# Patient Record
Sex: Female | Born: 1950 | Race: Black or African American | Hispanic: No | Marital: Married | Smoking: Never smoker
Health system: Southern US, Community
[De-identification: ages and names within clinical notes are randomized; demographics above are authoritative.]

## PROBLEM LIST (undated history)

## (undated) DIAGNOSIS — G4733 Obstructive sleep apnea (adult) (pediatric): Secondary | ICD-10-CM

## (undated) DIAGNOSIS — E119 Type 2 diabetes mellitus without complications: Secondary | ICD-10-CM

## (undated) DIAGNOSIS — Z9989 Dependence on other enabling machines and devices: Secondary | ICD-10-CM

## (undated) DIAGNOSIS — M1990 Unspecified osteoarthritis, unspecified site: Secondary | ICD-10-CM

## (undated) DIAGNOSIS — I10 Essential (primary) hypertension: Secondary | ICD-10-CM

## (undated) DIAGNOSIS — Z5189 Encounter for other specified aftercare: Secondary | ICD-10-CM

## (undated) DIAGNOSIS — I2510 Atherosclerotic heart disease of native coronary artery without angina pectoris: Secondary | ICD-10-CM

## (undated) DIAGNOSIS — Z9861 Coronary angioplasty status: Secondary | ICD-10-CM

## (undated) DIAGNOSIS — J45909 Unspecified asthma, uncomplicated: Secondary | ICD-10-CM

## (undated) HISTORY — PX: DERMOID CYST  EXCISION: SHX1452

## (undated) HISTORY — PX: OTHER SURGICAL HISTORY: SHX169

---

## 2018-02-09 ENCOUNTER — Inpatient Hospital Stay (HOSPITAL_COMMUNITY): Payer: BLUE CROSS/BLUE SHIELD

## 2018-02-09 ENCOUNTER — Emergency Department (HOSPITAL_COMMUNITY): Payer: BLUE CROSS/BLUE SHIELD

## 2018-02-09 ENCOUNTER — Other Ambulatory Visit: Payer: Self-pay

## 2018-02-09 ENCOUNTER — Encounter (HOSPITAL_COMMUNITY): Payer: Self-pay | Admitting: Emergency Medicine

## 2018-02-09 ENCOUNTER — Inpatient Hospital Stay (HOSPITAL_COMMUNITY)
Admission: EM | Admit: 2018-02-09 | Discharge: 2018-02-14 | DRG: 200 | Disposition: A | Discharge status: Home / Self Care | Payer: BLUE CROSS/BLUE SHIELD | Attending: General Surgery | Admitting: General Surgery

## 2018-02-09 DIAGNOSIS — S2249XA Multiple fractures of ribs, unspecified side, initial encounter for closed fracture: Secondary | ICD-10-CM | POA: Diagnosis present

## 2018-02-09 DIAGNOSIS — R109 Unspecified abdominal pain: Secondary | ICD-10-CM

## 2018-02-09 DIAGNOSIS — Z23 Encounter for immunization: Secondary | ICD-10-CM | POA: Diagnosis not present

## 2018-02-09 DIAGNOSIS — S20211A Contusion of right front wall of thorax, initial encounter: Secondary | ICD-10-CM

## 2018-02-09 DIAGNOSIS — G4733 Obstructive sleep apnea (adult) (pediatric): Secondary | ICD-10-CM | POA: Diagnosis present

## 2018-02-09 DIAGNOSIS — Z7982 Long term (current) use of aspirin: Secondary | ICD-10-CM

## 2018-02-09 DIAGNOSIS — Z9861 Coronary angioplasty status: Secondary | ICD-10-CM | POA: Diagnosis not present

## 2018-02-09 DIAGNOSIS — J939 Pneumothorax, unspecified: Secondary | ICD-10-CM

## 2018-02-09 DIAGNOSIS — I10 Essential (primary) hypertension: Secondary | ICD-10-CM | POA: Diagnosis present

## 2018-02-09 DIAGNOSIS — S270XXA Traumatic pneumothorax, initial encounter: Secondary | ICD-10-CM | POA: Diagnosis present

## 2018-02-09 DIAGNOSIS — E119 Type 2 diabetes mellitus without complications: Secondary | ICD-10-CM | POA: Diagnosis present

## 2018-02-09 DIAGNOSIS — Z7984 Long term (current) use of oral hypoglycemic drugs: Secondary | ICD-10-CM

## 2018-02-09 DIAGNOSIS — I2510 Atherosclerotic heart disease of native coronary artery without angina pectoris: Secondary | ICD-10-CM | POA: Diagnosis present

## 2018-02-09 DIAGNOSIS — Z9989 Dependence on other enabling machines and devices: Secondary | ICD-10-CM

## 2018-02-09 DIAGNOSIS — J90 Pleural effusion, not elsewhere classified: Secondary | ICD-10-CM | POA: Diagnosis present

## 2018-02-09 DIAGNOSIS — S2241XA Multiple fractures of ribs, right side, initial encounter for closed fracture: Secondary | ICD-10-CM | POA: Diagnosis present

## 2018-02-09 DIAGNOSIS — Y92410 Unspecified street and highway as the place of occurrence of the external cause: Secondary | ICD-10-CM

## 2018-02-09 HISTORY — DX: Unspecified osteoarthritis, unspecified site: M19.90

## 2018-02-09 HISTORY — DX: Obstructive sleep apnea (adult) (pediatric): G47.33

## 2018-02-09 HISTORY — DX: Type 2 diabetes mellitus without complications: E11.9

## 2018-02-09 HISTORY — DX: Coronary angioplasty status: Z98.61

## 2018-02-09 HISTORY — DX: Essential (primary) hypertension: I10

## 2018-02-09 HISTORY — DX: Unspecified asthma, uncomplicated: J45.909

## 2018-02-09 HISTORY — DX: Atherosclerotic heart disease of native coronary artery without angina pectoris: I25.10

## 2018-02-09 HISTORY — DX: Dependence on other enabling machines and devices: Z99.89

## 2018-02-09 HISTORY — DX: Encounter for other specified aftercare: Z51.89

## 2018-02-09 LAB — I-STAT CHEM 8, ED
BUN: 16 mg/dL (ref 8–23)
CALCIUM ION: 1.13 mmol/L — AB (ref 1.15–1.40)
CREATININE: 1 mg/dL (ref 0.44–1.00)
Chloride: 99 mmol/L (ref 98–111)
Glucose, Bld: 315 mg/dL — ABNORMAL HIGH (ref 70–99)
HEMATOCRIT: 38 % (ref 36.0–46.0)
HEMOGLOBIN: 12.9 g/dL (ref 12.0–15.0)
POTASSIUM: 3.2 mmol/L — AB (ref 3.5–5.1)
Sodium: 139 mmol/L (ref 135–145)
TCO2: 28 mmol/L (ref 22–32)

## 2018-02-09 LAB — COMPREHENSIVE METABOLIC PANEL
ALT: 100 U/L — ABNORMAL HIGH (ref 0–44)
ANION GAP: 11 (ref 5–15)
AST: 89 U/L — ABNORMAL HIGH (ref 15–41)
Albumin: 3.6 g/dL (ref 3.5–5.0)
Alkaline Phosphatase: 49 U/L (ref 38–126)
BILIRUBIN TOTAL: 1 mg/dL (ref 0.3–1.2)
BUN: 15 mg/dL (ref 8–23)
CO2: 26 mmol/L (ref 22–32)
Calcium: 8.9 mg/dL (ref 8.9–10.3)
Chloride: 101 mmol/L (ref 98–111)
Creatinine, Ser: 1.14 mg/dL — ABNORMAL HIGH (ref 0.44–1.00)
GFR calc Af Amer: 57 mL/min — ABNORMAL LOW (ref >60)
GFR, EST NON AFRICAN AMERICAN: 49 mL/min — AB (ref >60)
Glucose, Bld: 324 mg/dL — ABNORMAL HIGH (ref 70–99)
POTASSIUM: 3.3 mmol/L — AB (ref 3.5–5.1)
Sodium: 138 mmol/L (ref 135–145)
TOTAL PROTEIN: 6.9 g/dL (ref 6.5–8.1)

## 2018-02-09 LAB — CBC
HEMATOCRIT: 38.4 % (ref 36.0–46.0)
HEMOGLOBIN: 12.4 g/dL (ref 12.0–15.0)
MCH: 26.1 pg (ref 26.0–34.0)
MCHC: 32.3 g/dL (ref 30.0–36.0)
MCV: 80.7 fL (ref 78.0–100.0)
Platelets: 324 10*3/uL (ref 150–400)
RBC: 4.76 MIL/uL (ref 3.87–5.11)
RDW: 12.8 % (ref 11.5–15.5)
WBC: 9.8 10*3/uL (ref 4.0–10.5)

## 2018-02-09 LAB — GLUCOSE, CAPILLARY
Glucose-Capillary: 264 mg/dL — ABNORMAL HIGH (ref 70–99)
Glucose-Capillary: 271 mg/dL — ABNORMAL HIGH (ref 70–99)

## 2018-02-09 MED ORDER — MORPHINE SULFATE (PF) 4 MG/ML IV SOLN
2.0000 mg | INTRAVENOUS | Status: DC | PRN
  Administered 2018-02-09 – 2018-02-10 (×2): 3 mg via INTRAVENOUS
  Filled 2018-02-09 (×2): qty 1

## 2018-02-09 MED ORDER — ASPIRIN EC 81 MG PO TBEC
81.0000 mg | DELAYED_RELEASE_TABLET | Freq: Every day | ORAL | Status: DC
  Administered 2018-02-10 – 2018-02-14 (×5): 81 mg via ORAL
  Filled 2018-02-09 (×5): qty 1

## 2018-02-09 MED ORDER — GLIMEPIRIDE 2 MG PO TABS
4.0000 mg | ORAL_TABLET | Freq: Two times a day (BID) | ORAL | Status: DC
  Administered 2018-02-09 – 2018-02-14 (×10): 4 mg via ORAL
  Filled 2018-02-09: qty 1
  Filled 2018-02-09 (×3): qty 2
  Filled 2018-02-09 (×2): qty 1
  Filled 2018-02-09 (×5): qty 2
  Filled 2018-02-09: qty 1

## 2018-02-09 MED ORDER — INSULIN ASPART 100 UNIT/ML ~~LOC~~ SOLN
0.0000 [IU] | SUBCUTANEOUS | Status: DC
  Administered 2018-02-09: 5 [IU] via SUBCUTANEOUS
  Administered 2018-02-09 (×2): 8 [IU] via SUBCUTANEOUS
  Administered 2018-02-10 (×2): 3 [IU] via SUBCUTANEOUS
  Administered 2018-02-10: 5 [IU] via SUBCUTANEOUS
  Administered 2018-02-10 (×2): 3 [IU] via SUBCUTANEOUS
  Administered 2018-02-10 – 2018-02-11 (×2): 5 [IU] via SUBCUTANEOUS
  Administered 2018-02-11: 2 [IU] via SUBCUTANEOUS
  Administered 2018-02-11: 5 [IU] via SUBCUTANEOUS
  Administered 2018-02-11: 11 [IU] via SUBCUTANEOUS
  Administered 2018-02-11 – 2018-02-12 (×3): 3 [IU] via SUBCUTANEOUS
  Administered 2018-02-12: 2 [IU] via SUBCUTANEOUS
  Administered 2018-02-12: 5 [IU] via SUBCUTANEOUS
  Administered 2018-02-12: 2 [IU] via SUBCUTANEOUS
  Administered 2018-02-12: 5 [IU] via SUBCUTANEOUS
  Administered 2018-02-12: 8 [IU] via SUBCUTANEOUS
  Administered 2018-02-13: 3 [IU] via SUBCUTANEOUS
  Administered 2018-02-13: 5 [IU] via SUBCUTANEOUS

## 2018-02-09 MED ORDER — AMLODIPINE BESYLATE 5 MG PO TABS
5.0000 mg | ORAL_TABLET | Freq: Every day | ORAL | Status: DC
  Administered 2018-02-10 – 2018-02-14 (×5): 5 mg via ORAL
  Filled 2018-02-09 (×5): qty 1

## 2018-02-09 MED ORDER — MORPHINE SULFATE (PF) 4 MG/ML IV SOLN
6.0000 mg | Freq: Once | INTRAVENOUS | Status: AC
  Administered 2018-02-09: 6 mg via INTRAVENOUS
  Filled 2018-02-09: qty 2

## 2018-02-09 MED ORDER — OXYCODONE HCL 5 MG PO TABS
5.0000 mg | ORAL_TABLET | ORAL | Status: DC | PRN
  Administered 2018-02-09 – 2018-02-10 (×4): 5 mg via ORAL
  Filled 2018-02-09 (×4): qty 1

## 2018-02-09 MED ORDER — FAMOTIDINE 20 MG PO TABS
20.0000 mg | ORAL_TABLET | Freq: Two times a day (BID) | ORAL | Status: DC
  Administered 2018-02-09 – 2018-02-14 (×10): 20 mg via ORAL
  Filled 2018-02-09 (×11): qty 1

## 2018-02-09 MED ORDER — SENNOSIDES-DOCUSATE SODIUM 8.6-50 MG PO TABS
1.0000 | ORAL_TABLET | Freq: Two times a day (BID) | ORAL | Status: DC
  Administered 2018-02-10 – 2018-02-14 (×9): 1 via ORAL
  Filled 2018-02-09 (×9): qty 1

## 2018-02-09 MED ORDER — ONDANSETRON HCL 4 MG/2ML IJ SOLN: 4.0000 mg | Freq: Four times a day (QID) | INTRAMUSCULAR | Status: DC | PRN

## 2018-02-09 MED ORDER — IOHEXOL 300 MG/ML  SOLN
100.0000 mL | Freq: Once | INTRAMUSCULAR | Status: AC | PRN
  Administered 2018-02-09: 100 mL via INTRAVENOUS

## 2018-02-09 MED ORDER — POTASSIUM CHLORIDE IN NACL 20-0.9 MEQ/L-% IV SOLN
INTRAVENOUS | Status: DC
  Administered 2018-02-09 – 2018-02-10 (×2): via INTRAVENOUS
  Filled 2018-02-09 (×2): qty 1000

## 2018-02-09 MED ORDER — HYDROMORPHONE HCL 1 MG/ML IJ SOLN
1.0000 mg | Freq: Once | INTRAMUSCULAR | Status: AC
  Administered 2018-02-09: 1 mg via INTRAVENOUS
  Filled 2018-02-09: qty 1

## 2018-02-09 MED ORDER — ACETAMINOPHEN 500 MG PO TABS
1000.0000 mg | ORAL_TABLET | Freq: Three times a day (TID) | ORAL | Status: DC
  Administered 2018-02-09 – 2018-02-14 (×16): 1000 mg via ORAL
  Filled 2018-02-09 (×16): qty 2

## 2018-02-09 MED ORDER — METHOCARBAMOL 1000 MG/10ML IJ SOLN
500.0000 mg | Freq: Three times a day (TID) | INTRAVENOUS | Status: DC
  Administered 2018-02-09 – 2018-02-10 (×3): 500 mg via INTRAVENOUS
  Filled 2018-02-09 (×5): qty 5

## 2018-02-09 MED ORDER — SODIUM CHLORIDE 0.9 % IV BOLUS
1000.0000 mL | Freq: Once | INTRAVENOUS | Status: AC
  Administered 2018-02-09: 1000 mL via INTRAVENOUS

## 2018-02-09 MED ORDER — DIPHENHYDRAMINE HCL 50 MG/ML IJ SOLN: 12.5000 mg | Freq: Four times a day (QID) | INTRAMUSCULAR | Status: DC | PRN

## 2018-02-09 MED ORDER — ONDANSETRON 4 MG PO TBDP: 4.0000 mg | ORAL_TABLET | Freq: Four times a day (QID) | ORAL | Status: DC | PRN

## 2018-02-09 MED ORDER — DIPHENHYDRAMINE HCL 12.5 MG/5ML PO ELIX: 12.5000 mg | ORAL_SOLUTION | Freq: Four times a day (QID) | ORAL | Status: DC | PRN

## 2018-02-09 MED ORDER — NITROGLYCERIN 0.4 MG SL SUBL: 0.4000 mg | SUBLINGUAL_TABLET | SUBLINGUAL | Status: DC | PRN
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
## 2018-02-09 NOTE — ED Notes (Signed)
Trauma at bedside.
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
## 2018-02-09 NOTE — Progress Notes (Signed)
Received patient from ED. Patient alert and oriented x4. Patient's son at bedside. Was called by patient placement and told patient will be transferring to 4N. Called report to [place], Charity fundraiserN. Rapid response at bedside to assist in transfer.

## 2018-02-09 NOTE — ED Notes (Signed)
Patient transported to CT 

## 2018-02-10 ENCOUNTER — Encounter (HOSPITAL_COMMUNITY): Payer: Self-pay | Admitting: General Practice

## 2018-02-10 ENCOUNTER — Inpatient Hospital Stay (HOSPITAL_COMMUNITY): Payer: BLUE CROSS/BLUE SHIELD

## 2018-02-10 LAB — CBC
HEMATOCRIT: 35.6 % — AB (ref 36.0–46.0)
Hemoglobin: 11.4 g/dL — ABNORMAL LOW (ref 12.0–15.0)
MCH: 25.6 pg — ABNORMAL LOW (ref 26.0–34.0)
MCHC: 32 g/dL (ref 30.0–36.0)
MCV: 79.8 fL (ref 78.0–100.0)
Platelets: 249 10*3/uL (ref 150–400)
RBC: 4.46 MIL/uL (ref 3.87–5.11)
RDW: 12.8 % (ref 11.5–15.5)
WBC: 9.8 10*3/uL (ref 4.0–10.5)

## 2018-02-10 LAB — GLUCOSE, CAPILLARY
GLUCOSE-CAPILLARY: 173 mg/dL — AB (ref 70–99)
GLUCOSE-CAPILLARY: 191 mg/dL — AB (ref 70–99)
GLUCOSE-CAPILLARY: 178 mg/dL — AB (ref 70–99)
Glucose-Capillary: 174 mg/dL — ABNORMAL HIGH (ref 70–99)
Glucose-Capillary: 205 mg/dL — ABNORMAL HIGH (ref 70–99)
Glucose-Capillary: 215 mg/dL — ABNORMAL HIGH (ref 70–99)
Glucose-Capillary: 218 mg/dL — ABNORMAL HIGH (ref 70–99)

## 2018-02-10 LAB — HEMOGLOBIN A1C
Hgb A1c MFr Bld: 7.9 % — ABNORMAL HIGH (ref 4.8–5.6)
Mean Plasma Glucose: 180.03 mg/dL

## 2018-02-10 LAB — BASIC METABOLIC PANEL
Anion gap: 11 (ref 5–15)
BUN: 8 mg/dL (ref 8–23)
CO2: 29 mmol/L (ref 22–32)
Calcium: 8.8 mg/dL — ABNORMAL LOW (ref 8.9–10.3)
Chloride: 97 mmol/L — ABNORMAL LOW (ref 98–111)
Creatinine, Ser: 0.81 mg/dL (ref 0.44–1.00)
GFR calc Af Amer: >60 mL/min (ref >60)
GFR calc non Af Amer: >60 mL/min (ref >60)
GLUCOSE: 197 mg/dL — AB (ref 70–99)
POTASSIUM: 3.5 mmol/L (ref 3.5–5.1)
Sodium: 137 mmol/L (ref 135–145)

## 2018-02-10 LAB — HIV ANTIBODY (ROUTINE TESTING W REFLEX): HIV SCREEN 4TH GENERATION: NONREACTIVE

## 2018-02-10 LAB — MRSA PCR SCREENING: MRSA BY PCR: NEGATIVE

## 2018-02-10 LAB — PROTIME-INR
INR: 0.98
Prothrombin Time: 12.9 seconds (ref 11.4–15.2)

## 2018-02-10 MED ORDER — POLYETHYLENE GLYCOL 3350 17 G PO PACK
17.0000 g | PACK | Freq: Every day | ORAL | Status: DC
  Administered 2018-02-10 – 2018-02-14 (×5): 17 g via ORAL
  Filled 2018-02-10 (×5): qty 1

## 2018-02-10 MED ORDER — ENOXAPARIN SODIUM 40 MG/0.4ML ~~LOC~~ SOLN
40.0000 mg | SUBCUTANEOUS | Status: DC
  Administered 2018-02-10 – 2018-02-14 (×5): 40 mg via SUBCUTANEOUS
  Filled 2018-02-10 (×5): qty 0.4

## 2018-02-10 MED ORDER — TRAMADOL HCL 50 MG PO TABS
50.0000 mg | ORAL_TABLET | Freq: Four times a day (QID) | ORAL | Status: DC
  Administered 2018-02-10 – 2018-02-14 (×18): 50 mg via ORAL
  Filled 2018-02-10 (×18): qty 1

## 2018-02-10 MED ORDER — INSULIN GLARGINE 100 UNIT/ML ~~LOC~~ SOLN
10.0000 [IU] | Freq: Every day | SUBCUTANEOUS | Status: DC
  Administered 2018-02-10: 10 [IU] via SUBCUTANEOUS
  Filled 2018-02-10: qty 0.1

## 2018-02-10 MED ORDER — OXYCODONE HCL 5 MG PO TABS
5.0000 mg | ORAL_TABLET | ORAL | Status: DC | PRN
  Administered 2018-02-10: 10 mg via ORAL
  Administered 2018-02-10: 5 mg via ORAL
  Administered 2018-02-11 – 2018-02-12 (×4): 10 mg via ORAL
  Filled 2018-02-10: qty 1
  Filled 2018-02-10 (×5): qty 2

## 2018-02-10 MED ORDER — METHOCARBAMOL 500 MG PO TABS: 500.0000 mg | ORAL_TABLET | Freq: Three times a day (TID) | ORAL | Status: DC

## 2018-02-10 MED ORDER — MORPHINE SULFATE (PF) 2 MG/ML IV SOLN
2.0000 mg | INTRAVENOUS | Status: DC | PRN
  Administered 2018-02-10 – 2018-02-11 (×3): 2 mg via INTRAVENOUS
  Filled 2018-02-10 (×3): qty 1

## 2018-02-10 MED ORDER — METHOCARBAMOL 500 MG PO TABS
500.0000 mg | ORAL_TABLET | Freq: Three times a day (TID) | ORAL | Status: DC
  Administered 2018-02-10 – 2018-02-14 (×13): 500 mg via ORAL
  Filled 2018-02-10 (×13): qty 1

## 2018-02-10 MED ORDER — PNEUMOCOCCAL VAC POLYVALENT 25 MCG/0.5ML IJ INJ
0.5000 mL | INJECTION | INTRAMUSCULAR | Status: AC
  Administered 2018-02-11: 0.5 mL via INTRAMUSCULAR
  Filled 2018-02-10: qty 0.5

## 2018-02-10 NOTE — Progress Notes (Signed)
Received patient to room 4NP03. Patient walked to new room and to BR with walker and assist X2. Patient is orientated to the room and call system. Patient c/o rib pain rated 9 out of 10. Pain med given, see EMAR. Patient hooked up to continuous cardiac monitor and VS are taken. Fresh water is given. Will continue to monitor.
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
## 2018-02-10 NOTE — Progress Notes (Addendum)
Inpatient Diabetes Program Recommendations  AACE/ADA: New Consensus Statement on Inpatient Glycemic Control (2019)  Target Ranges:  Prepandial:   less than 140 mg/dL      Peak postprandial:   less than 180 mg/dL (1-2 hours)      Critically ill patients:  140 - 180 mg/dL   Results for Haley Perry, Haley Perry (MRN [id number]) as of 02/10/2018 07:49  Ref. Range 02/09/2018 18:42 02/09/2018 20:13 02/09/2018 23:45 02/10/2018 03:51  Glucose-Capillary Latest Ref Range: 70 - 99 mg/dL 119264 (H) 147271 (H) 829215 (H) 174 (H)   Results for Haley Perry, Haley Perry (MRN [id number]) as of 02/10/2018 07:49  Ref. Range 02/10/2018 02:59  Hemoglobin A1C Latest Ref Range: 4.8 - 5.6 % 7.9 (H)   Review of Glycemic Control  Diabetes history: DM2 Outpatient Diabetes medications: Amaryl 4 mg BID, Metformin XR 1000 mg BID, Ozempic 0.5 mg Qweek (Wednesday) Current orders for Inpatient glycemic control: Amaryl 4 mg BID, Novolog 0-15 units Q4H  Inpatient Diabetes Program Recommendations: Insulin - Basal: Please consider ordering Lantus 10 units Q24H. HgbA1C: A1C 7.9% on 02/10/18 indicating an average glucose of 180 mg/dl over the past 2-3 months.  NOTE: Noted consult for Diabetes Coordinator. Chart reviewed. Will plan to talk with patient today.  Addendum 02/10/18@11 :15-Spoke with patient about diabetes and home regimen for diabetes control. Patient reports that she takes  Amaryl 4 mg BID, Metformin XR 1000 mg BID, Ozempic 0.5 mg Qweek (Wednesday) as an outpatient for diabetes control. Patient reports that she is taking DM medications as prescribed.  Patient states that she checks her glucose 1 time per day in the mornings and it is usually in the 100's mg/dl. Patient reports that she exercises regularly and follows carb modified diet.  Inquired about prior A1C and patient reports that her last A1C value was 7.3% 2 weeks ago.Informed of current A1C results (7.9% on 02/10/18).  Discussed glucose and A1C goals. Discussed current orders for  inpatient glycemic control. Discussed Lantus and Novolog insulin which will be used inpatient.  Patient states that she has a good appetite. Patient reports that she is in pain. Encouraged patient to call RN regarding pain to see if she is able to get pain medication now.  Patient verbalized understanding of information discussed and she states that she has no further questions at this time related to diabetes. Will continue to follow while inpatient.  Thanks, Orlando PennerMarie Yehudis Monceaux, RN, MSN, CDE Diabetes Coordinator Inpatient Diabetes Program [phone] (Team Pager from 8am to 5pm)
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
## 2018-02-11 ENCOUNTER — Encounter (HOSPITAL_COMMUNITY): Payer: Self-pay

## 2018-02-11 ENCOUNTER — Inpatient Hospital Stay (HOSPITAL_COMMUNITY): Payer: BLUE CROSS/BLUE SHIELD

## 2018-02-11 ENCOUNTER — Other Ambulatory Visit: Payer: Self-pay

## 2018-02-11 LAB — GLUCOSE, CAPILLARY
GLUCOSE-CAPILLARY: 185 mg/dL — AB (ref 70–99)
GLUCOSE-CAPILLARY: 213 mg/dL — AB (ref 70–99)
Glucose-Capillary: 152 mg/dL — ABNORMAL HIGH (ref 70–99)
Glucose-Capillary: 143 mg/dL — ABNORMAL HIGH (ref 70–99)
Glucose-Capillary: 219 mg/dL — ABNORMAL HIGH (ref 70–99)
Glucose-Capillary: 301 mg/dL — ABNORMAL HIGH (ref 70–99)

## 2018-02-11 MED ORDER — INSULIN GLARGINE 100 UNIT/ML ~~LOC~~ SOLN
14.0000 [IU] | Freq: Every day | SUBCUTANEOUS | Status: DC
  Administered 2018-02-11 – 2018-02-13 (×3): 14 [IU] via SUBCUTANEOUS
  Filled 2018-02-11 (×4): qty 0.14

## 2018-02-11 MED ORDER — BISACODYL 10 MG RE SUPP
10.0000 mg | Freq: Every day | RECTAL | Status: DC | PRN
  Administered 2018-02-11: 10 mg via RECTAL
  Filled 2018-02-11: qty 1
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
## 2018-02-11 NOTE — Progress Notes (Signed)
Inpatient Diabetes Program Recommendations  AACE/ADA: New Consensus Statement on Inpatient Glycemic Control (2019)  Target Ranges:  Prepandial:   less than 140 mg/dL      Peak postprandial:   less than 180 mg/dL (1-2 hours)      Critically ill patients:  140 - 180 mg/dL   Results for Haley Perry, Haley Perry (MRN [id number]) as of 02/11/2018 07:38  Ref. Range 02/10/2018 08:22 02/10/2018 11:49 02/10/2018 16:13 02/10/2018 20:01 02/10/2018 23:20 02/11/2018 03:14  Glucose-Capillary Latest Ref Range: 70 - 99 mg/dL 409173 (H) 811205 (H) 914191 (H) 178 (H) 218 (H) 152 (H)   Review of Glycemic Control  Diabetes history: DM2 Outpatient Diabetes medications: Amaryl 4 mg BID, Metformin XR 1000 mg BID, Ozempic 0.5 mg Qweek (Wednesday) Current orders for Inpatient glycemic control: Lantus 10 units QHS, Amaryl 4 mg BID, Novolog 0-15 units Q4H  Inpatient Diabetes Program Recommendations: Insulin - Basal: Please consider increasing Lantus to 14 units QHS. HgbA1C: A1C 7.9% on 02/10/18 indicating an average glucose of 180 mg/dl over the past 2-3 months.  Thanks, Orlando PennerMarie Shahla Betsill, RN, MSN, CDE Diabetes Coordinator Inpatient Diabetes Program [phone] (Team Pager from 8am to 5pm)

## 2018-02-12 ENCOUNTER — Inpatient Hospital Stay (HOSPITAL_COMMUNITY): Payer: BLUE CROSS/BLUE SHIELD

## 2018-02-12 LAB — GLUCOSE, CAPILLARY
GLUCOSE-CAPILLARY: 149 mg/dL — AB (ref 70–99)
GLUCOSE-CAPILLARY: 248 mg/dL — AB (ref 70–99)
Glucose-Capillary: 155 mg/dL — ABNORMAL HIGH (ref 70–99)
Glucose-Capillary: 144 mg/dL — ABNORMAL HIGH (ref 70–99)
Glucose-Capillary: 270 mg/dL — ABNORMAL HIGH (ref 70–99)
Glucose-Capillary: 212 mg/dL — ABNORMAL HIGH (ref 70–99)

## 2018-02-12 LAB — POCT I-STAT 3, ART BLOOD GAS (G3+)
Acid-Base Excess: 3 mmol/L — ABNORMAL HIGH (ref 0.0–2.0)
Bicarbonate: 28.3 mmol/L — ABNORMAL HIGH (ref 20.0–28.0)
O2 SAT: 94 %
TCO2: 30 mmol/L (ref 22–32)
pCO2 arterial: 43 mmHg (ref 32.0–48.0)
pH, Arterial: 7.425 (ref 7.350–7.450)
pO2, Arterial: 68 mmHg — ABNORMAL LOW (ref 83.0–108.0)

## 2018-02-12 MED ORDER — KETOROLAC TROMETHAMINE 30 MG/ML IJ SOLN
30.0000 mg | Freq: Three times a day (TID) | INTRAMUSCULAR | Status: AC
  Administered 2018-02-12 – 2018-02-14 (×6): 30 mg via INTRAVENOUS
  Filled 2018-02-12 (×6): qty 1

## 2018-02-12 MED ORDER — IPRATROPIUM-ALBUTEROL 0.5-2.5 (3) MG/3ML IN SOLN
3.0000 mL | Freq: Four times a day (QID) | RESPIRATORY_TRACT | Status: DC | PRN
  Administered 2018-02-12: 3 mL via RESPIRATORY_TRACT
  Filled 2018-02-12: qty 3

## 2018-02-12 NOTE — Progress Notes (Signed)
RT attempted to obtain ABG x 2. First attempt patient was in the bathroom. Second attempt patient walked without her O2 on. RT will wait 30 mins and obtain ABG.
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
## 2018-02-12 NOTE — Progress Notes (Signed)
Inpatient Diabetes Program Recommendations  AACE/ADA: New Consensus Statement on Inpatient Glycemic Control (2015)  Target Ranges:  Prepandial:   less than 140 mg/dL      Peak postprandial:   less than 180 mg/dL (1-2 hours)      Critically ill patients:  140 - 180 mg/dL   Lab Results  Component Value Date   GLUCAP 144 (H) 02/12/2018   HGBA1C 7.9 (H) 02/10/2018    Review of Glycemic Control Results for Haley Perry, Haley Perry (MRN [id number]) as of 02/12/2018 08:55  Ref. Range 02/11/2018 19:52 02/11/2018 23:31 02/12/2018 04:21 02/12/2018 07:42  Glucose-Capillary Latest Ref Range: 70 - 99 mg/dL 409301 (H) 811185 (H) 914155 (H) 144 (H)    Diabetes history:DM2 Outpatient Diabetes medications:Amaryl 4 mg BID, Metformin XR 1000 mg BID, Ozempic 0.5 mg Qweek (Wednesday) Current orders for Inpatient glycemic control:Lantus 14 units QHS, Amaryl 4 mg BID, Novolog 0-15 units Q4H   Inpatient Diabetes Program Recommendations:   Given patient is eating consistently, consider switching Novolog 0-15 to TID, adding Novolog 0-5 units QHS and Novolog 4 units TID (asusming patient is consuming >50%).   Thanks, Haley RaveLauren Ledora Delker, MSN, RNC-OB Diabetes Coordinator [phone] (8a-5p)

## 2018-02-12 NOTE — Care Management Important Message (Signed)
Important Message  Patient Details  Name: Audrea MuscatJacquline Kohlbeck MRN: [id number] Date of Birth: 04/28/1951   Medicare Important Message Given:  Yes    Bohden Dung 02/12/2018, 3:35 PM

## 2018-02-13 ENCOUNTER — Inpatient Hospital Stay (HOSPITAL_COMMUNITY): Payer: BLUE CROSS/BLUE SHIELD

## 2018-02-13 LAB — GLUCOSE, CAPILLARY
GLUCOSE-CAPILLARY: 88 mg/dL (ref 70–99)
GLUCOSE-CAPILLARY: 193 mg/dL — AB (ref 70–99)
Glucose-Capillary: 209 mg/dL — ABNORMAL HIGH (ref 70–99)
Glucose-Capillary: 219 mg/dL — ABNORMAL HIGH (ref 70–99)
Glucose-Capillary: 193 mg/dL — ABNORMAL HIGH (ref 70–99)

## 2018-02-13 MED ORDER — GLYCERIN (LAXATIVE) 2.1 G RE SUPP
1.0000 | Freq: Every day | RECTAL | Status: DC | PRN
  Filled 2018-02-13: qty 1

## 2018-02-13 MED ORDER — INSULIN ASPART 100 UNIT/ML ~~LOC~~ SOLN
4.0000 [IU] | Freq: Three times a day (TID) | SUBCUTANEOUS | Status: DC
  Administered 2018-02-13 – 2018-02-14 (×4): 4 [IU] via SUBCUTANEOUS

## 2018-02-13 MED ORDER — LIDOCAINE HCL (PF) 2 % IJ SOLN
INTRAMUSCULAR | Status: AC
  Filled 2018-02-13: qty 20

## 2018-02-13 MED ORDER — INSULIN ASPART 100 UNIT/ML ~~LOC~~ SOLN
0.0000 [IU] | Freq: Three times a day (TID) | SUBCUTANEOUS | Status: DC
  Administered 2018-02-13: 5 [IU] via SUBCUTANEOUS
  Administered 2018-02-14: 2 [IU] via SUBCUTANEOUS
  Administered 2018-02-14: 3 [IU] via SUBCUTANEOUS
  Administered 2018-02-14: 5 [IU] via SUBCUTANEOUS
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
## 2018-02-13 NOTE — Progress Notes (Addendum)
Inpatient Diabetes Program Recommendations  AACE/ADA: New Consensus Statement on Inpatient Glycemic Control (2015)  Target Ranges:  Prepandial:   less than 140 mg/dL      Peak postprandial:   less than 180 mg/dL (1-2 hours)      Critically ill patients:  140 - 180 mg/dL   Lab Results  Component Value Date   GLUCAP 193 (H) 02/13/2018   HGBA1C 7.9 (H) 02/10/2018    Review of Glycemic Control Results for Audrea MuscatFLEMMINGS, Anshi (MRN [id number]) as of 02/13/2018 12:22  Ref. Range 02/12/2018 20:13 02/12/2018 23:37 02/13/2018 04:34 02/13/2018 07:53  Glucose-Capillary Latest Ref Range: 70 - 99 mg/dL 409212 (H) 811149 (H) 88 914193 (H)    Diabetes history:DM2 Outpatient Diabetes medications:Amaryl 4 mg BID, Metformin XR 1000 mg BID, Ozempic 0.5 mg Qweek (Wednesday) Current orders for Inpatient glycemic control:Lantus 14 units QHS,Amaryl 4 mg BID, Novolog 0-15 units Q4H  Inpatient Diabetes Program Recommendations:   Given patient is eating consistently, consider switching Novolog 0-15 to TID and Novolog 4 units TID (asusming patient is consuming >50%). Orders placed after speaking with Tresa EndoKelly, [place].  Thanks, Lujean RaveLauren Clay Menser, MSN, RNC-OB Diabetes Coordinator [phone] (8a-5p)  Addendum@1415 : Spoke with patient regarding diabetes. Verified home medications. [place] in [place], Dr Dallas SchimkePater.  Reviewed patient's current A1c of 7.9%, which is down from 10% (per patient). Explained what a A1c is and what it measures. Also reviewed goal A1c with patient, importance of good glucose control @ home, and blood sugar goals. Educated on current needs for insulin, long term comorbidites, and vascular changes that occur. Patient reports checking blood sugars 2-3 times per day. Has an appointment with endo in November, and plans to follow up with PCP in 2 weeks. Encouraged to continue with current diet and take meter with her to next appointment. Patient has no further questions regarding DM.
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
## 2018-02-13 NOTE — Progress Notes (Signed)
Placed pt on cpap with pressure of 8. And nasal mask with 3L bled in. Pt tolerating well. Rt to cont to monitor as needed.

## 2018-02-14 LAB — GLUCOSE, CAPILLARY
Glucose-Capillary: 119 mg/dL — ABNORMAL HIGH (ref 70–99)
Glucose-Capillary: 140 mg/dL — ABNORMAL HIGH (ref 70–99)
Glucose-Capillary: 145 mg/dL — ABNORMAL HIGH (ref 70–99)
Glucose-Capillary: 199 mg/dL — ABNORMAL HIGH (ref 70–99)
Glucose-Capillary: 233 mg/dL — ABNORMAL HIGH (ref 70–99)

## 2018-02-14 MED ORDER — LOSARTAN POTASSIUM 50 MG PO TABS: 50.0000 mg | ORAL_TABLET | Freq: Two times a day (BID) | ORAL | 0 refills | Status: AC

## 2018-02-14 MED ORDER — GLIMEPIRIDE 4 MG PO TABS: 4.0000 mg | ORAL_TABLET | Freq: Two times a day (BID) | ORAL | 0 refills | Status: AC

## 2018-02-14 MED ORDER — AMLODIPINE BESYLATE 5 MG PO TABS: 5.0000 mg | ORAL_TABLET | Freq: Every day | ORAL | 0 refills | Status: AC

## 2018-02-14 MED ORDER — OXYCODONE HCL 5 MG PO TABS: 5.0000 mg | ORAL_TABLET | Freq: Four times a day (QID) | ORAL | 0 refills | Status: AC | PRN

## 2018-02-14 MED ORDER — METHOCARBAMOL 500 MG PO TABS: 500.0000 mg | ORAL_TABLET | Freq: Three times a day (TID) | ORAL | 0 refills | Status: AC

## 2018-02-14 MED ORDER — METFORMIN HCL ER 500 MG PO TB24: 1000.0000 mg | ORAL_TABLET | Freq: Two times a day (BID) | ORAL | 0 refills | Status: AC

## 2018-02-14 MED ORDER — OZEMPIC (0.25 OR 0.5 MG/DOSE) 2 MG/1.5ML ~~LOC~~ SOPN: 0.5000 mg | PEN_INJECTOR | SUBCUTANEOUS | 0 refills | Status: AC

## 2018-02-14 MED ORDER — TRAMADOL HCL 50 MG PO TABS: 50.0000 mg | ORAL_TABLET | Freq: Four times a day (QID) | ORAL | 0 refills | Status: AC

## 2018-02-14 MED ORDER — METOPROLOL SUCCINATE ER 25 MG PO TB24: 25.0000 mg | ORAL_TABLET | Freq: Every day | ORAL | 0 refills | Status: AC

## 2018-02-14 MED ORDER — SIMVASTATIN 20 MG PO TABS: 20.0000 mg | ORAL_TABLET | Freq: Every day | ORAL | 0 refills | Status: AC

## 2018-02-14 MED ORDER — HYDROCHLOROTHIAZIDE 25 MG PO TABS: 25.0000 mg | ORAL_TABLET | Freq: Every day | ORAL | 0 refills | Status: AC
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
## 2018-02-14 NOTE — Progress Notes (Signed)
SATURATION QUALIFICATIONS: (This note is used to comply with regulatory documentation for home oxygen)  Patient Saturations on Room Air at Rest = 90%  Patient Saturations on Room Air while Ambulating = 81%  Patient Saturations on 2 Liters of oxygen while Ambulating = 96%  Please briefly explain why patient needs home oxygen: Pt oxygen drops to 81% when ambulating and has periods of shortness of breath. Jillyn HiddenStone,Erminio Nygard R, RN

## 2018-02-14 NOTE — Progress Notes (Signed)
   02/14/18 1200  PT Time Calculation  PT Start Time (ACUTE ONLY) 1102  PT Stop Time (ACUTE ONLY) 1142  PT Time Calculation (min) (ACUTE ONLY) 40 min  PT General Charges  $$ ACUTE PT VISIT 1 Visit  PT Treatments  $Gait Training 23-37 mins  $Therapeutic Activity 8-22 mins   AshfordJennifer Stevon Gough, [place], [place] [phone]

## 2018-02-14 NOTE — Discharge Instructions (Signed)
1. PAIN CONTROL:  1. Pain is best controlled by a usual combination of three different methods TOGETHER:  1. Ice/Heat 2. Over the counter pain medication 3. Prescription pain medication 2. Most patients will experience some swelling and bruising around ribs. Ice packs or heating pads (30-60 minutes up to 6 times a day) will help. Use ice for the first few days to help decrease swelling and bruising, then switch to heat to help relax tight/sore spots and speed recovery. Some people prefer to use ice alone, heat alone, alternating between ice & heat. Experiment to what works for you. Swelling and bruising can take several weeks to resolve.  3. It is helpful to take an over-the-counter pain medication regularly for the first few weeks. Choose one of the following that works best for you:  1. Naproxen (Aleve, etc) Two 220mg  tabs twice a day 2. Ibuprofen (Advil, etc) Three 200mg  tabs four times a day (every meal & bedtime) 3. Acetaminophen (Tylenol, etc) 500-650mg  four times a day (every meal & bedtime) 4. A prescription for pain medication (such as oxycodone, hydrocodone, etc) should be given to you upon discharge. Take your pain medication as prescribed.  1. If you are having problems/concerns with the prescription medicine (does not control pain, nausea, vomiting, rash, itching, etc), please call us [phone] to see if we need to switch you to a different pain medicine that will work better for you and/or control your side effect better. 2. If you need a refill on your pain medication, please contact your pharmacy. They will contact our office to request authorization. Prescriptions will not be filled after 5 pm or on week-ends. 4. Avoid getting constipated. When taking pain medications, it is common to experience some constipation. Increasing fluid intake and taking a fiber supplement (such as Metamucil, Citrucel, FiberCon, MiraLax, etc) 1-2 times a day regularly will usually help prevent this  problem from occurring. A mild laxative (prune juice, Milk of Magnesia, MiraLax, etc) should be taken according to package directions if there are no bowel movements after 48 hours.  5. Watch out for diarrhea. If you have many loose bowel movements, simplify your diet to bland foods & liquids for a few days. Stop any stool softeners and decrease your fiber supplement. Switching to mild anti-diarrheal medications (Kayopectate, Pepto Bismol) can help. If this worsens or does not improve, please call us. 6. FOLLOW UP in our office  1. Please call [facility] at [phone] to set up an appointment for a follow-up appointment approximately 2-3 weeks after discharge for xray and follow up  WHEN TO CALL us [phone]:  1. Poor pain control 2. Reactions / problems with new medications (rash/itching, nausea, etc)  3. Fever over 101.5 F (38.5 C) 4. Worsening swelling or bruising 5. Worsening cough or any concerning symptoms  IF YOU EXPERIENCE WORSENING CHEST PAIN, DIFFICULTY BREATHING OR SHORTNESS OF BREATH GO DIRECTLY TO THE ER.  The clinic staff is available to answer your questions during regular business hours (8:30am-5pm). Please dont hesitate to call and ask to speak to one of our nurses for clinical concerns.  If you have a medical emergency, go to the nearest emergency room or call 911.  A surgeon from [facility] is always on call at the [facility], [place]  [address], [address], [place], [place] [address] ?  MAIN: [phone] ? TOLL FREE: [phone] ?  FAX [phone]  [email or web address]   Rib Fracture A  rib fracture is a break or crack in one of the bones of the ribs. The ribs are a group of long, curved bones that wrap around your chest and attach to your spine. They protect your lungs and other organs in the chest cavity. A broken or cracked rib is often painful, but most do not cause other problems. Most rib fractures  heal on their own over time. However, rib fractures can be more serious if multiple ribs are broken or if broken ribs move out of place and push against other structures. What are the causes?  A direct blow to the chest. For example, this could happen during contact sports, a car accident, or a fall against a hard object.  Repetitive movements with high force, such as pitching a baseball or having severe coughing spells. What are the signs or symptoms?  Pain when you breathe in or cough.  Pain when someone presses on the injured area. How is this diagnosed? Your caregiver will perform a physical exam. Various imaging tests may be ordered to confirm the diagnosis and to look for related injuries. These tests may include a chest X-ray, computed tomography (CT), magnetic resonance imaging (MRI), or a bone scan. How is this treated? Rib fractures usually heal on their own in 1-3 months. The longer healing period is often associated with a continued cough or other aggravating activities. During the healing period, pain control is very important. Medication is usually given to control pain. Hospitalization or surgery may be needed for more severe injuries, such as those in which multiple ribs are broken or the ribs have moved out of place. Follow these instructions at home:  Avoid strenuous activity and any activities or movements that cause pain. Be careful during activities and avoid bumping the injured rib.  Gradually increase activity as directed by your caregiver.  Only take over-the-counter or prescription medications as directed by your caregiver. Do not take other medications without asking your caregiver first.  Apply ice to the injured area for the first 1-2 days after you have been treated or as directed by your caregiver. Applying ice helps to reduce inflammation and pain. ? Put ice in a plastic bag. ? Place a towel between your skin and the bag. ? Leave the ice on for 15-20 minutes  at a time, every 2 hours while you are awake.  Perform deep breathing as directed by your caregiver. This will help prevent pneumonia, which is a common complication of a broken rib. Your caregiver may instruct you to: ? Take deep breaths several times a day. ? Try to cough several times a day, holding a pillow against the injured area. ? Use a device called an incentive spirometer to practice deep breathing several times a day.  Drink enough fluids to keep your urine clear or pale yellow. This will help you avoid constipation.  Do not wear a rib belt or binder. These restrict breathing, which can lead to pneumonia. Get help right away if:  You have a fever.  You have difficulty breathing or shortness of breath.  You develop a continual cough, or you cough up thick or bloody sputum.  You feel sick to your stomach (nausea), throw up (vomit), or have abdominal pain.  You have worsening pain not controlled with medications. This information is not intended to replace advice given to you by your health care provider. Make sure you discuss any questions you have with your health care provider. Document Released: 07/02/2005 Document Revised: 12/08/2015  Document Reviewed: 09/03/2012 Elsevier Interactive Patient Education  [employer].    Pneumothorax A pneumothorax, commonly called a collapsed lung, is a condition in which air leaks from a lung and builds up in the space between the lung and the chest wall (pleural space). The air in a pneumothorax is trapped outside the lung and takes up space, preventing the lung from fully expanding. This is a condition that usually occurs suddenly. The buildup of air may be small or large. A small pneumothorax may go away on its own. When a pneumothorax is larger, it will often require medical treatment and hospitalization. What are the causes? A pneumothorax can sometimes happen quickly with no apparent cause. People with underlying lung problems,  particularly COPD or emphysema, are at higher risk of pneumothorax. However, pneumothorax can happen quickly even in people with no prior known lung problems. Trauma, surgery, medical procedures, or injury to the chest wall can also cause a pneumothorax. What are the signs or symptoms? Sometimes a pneumothorax will have no symptoms. When symptoms are present, they can include:  Chest pain.  Shortness of breath.  Increased rate of breathing.  Bluish color to your lips or skin (cyanosis).  How is this diagnosed? Pneumothorax is usually diagnosed by a chest X-ray or chest CT scan. Your health care provider will also take a medical history and perform a physical exam to determine why you may have a pneumothorax. How is this treated? A small pneumothorax may go away on its own without treatment. Extra oxygen can sometimes help a small pneumothorax go away more quickly. For a larger pneumothorax or a pneumothorax that is causing symptoms, a procedure is usually needed to drain the air.In some cases, the health care provider may drain the air using a needle. In other cases, a chest tube may be inserted into the pleural space. A chest tube is a small tube placed between the ribs and into the pleural space. This removes the extra air and allows the lung to expand back to its normal size. A large pneumothorax will usually require a hospital stay. If there is ongoing air leakage into the pleural space, then the chest tube may need to remain in place for several days until the air leak has healed. In some cases, surgery may be needed. Follow these instructions at home:  Only take over-the-counter or prescription medicines as directed by your health care provider.  If a cough or pain makes it difficult for you to sleep at night, try sleeping in a semi-upright position in a recliner or by using 2 or 3 pillows.  Rest and limit activity as directed by your health care provider.  If you had a chest tube and  it was removed, ask your health care provider when it is okay to remove the dressing. Until your health care provider says you can remove the dressing, do not allow it to get wet.  Do not smoke. Smoking is a risk factor for pneumothorax.  Do not fly in an airplane or scuba dive until your health care provider says it is okay.  Follow up with your health care provider as directed. Get help right away if:  You have increasing chest pain or shortness of breath.  You have a cough that is not controlled with suppressants.  You begin coughing up blood.  You have pain that is getting worse or is not controlled with medicines.  You cough up thick, discolored mucus (sputum) that is yellow to green  in color.  You have redness, increasing pain, or discharge at the site where a chest tube had been in place (if your pneumothorax was treated with a chest tube).  The site where your chest tube was located opens up.  You feel air coming out of the site where the chest tube was placed.  You have a fever or persistent symptoms for more than 2-3 days.  You have a fever and your symptoms suddenly get worse. This information is not intended to replace advice given to you by your health care provider. Make sure you discuss any questions you have with your health care provider. Document Released: 07/02/2005 Document Revised: 12/08/2015 Document Reviewed: 11/25/2013 Elsevier Interactive Patient Education  [employer]2018 [employer]

## 2018-02-24 ENCOUNTER — Other Ambulatory Visit: Payer: Self-pay | Admitting: General Surgery

## 2018-02-24 ENCOUNTER — Ambulatory Visit
Admission: RE | Admit: 2018-02-24 | Discharge: 2018-02-24 | Disposition: A | Discharge status: Home / Self Care | Payer: BLUE CROSS/BLUE SHIELD | Source: Ambulatory Visit | Attending: General Surgery | Admitting: General Surgery

## 2018-02-24 DIAGNOSIS — J939 Pneumothorax, unspecified: Secondary | ICD-10-CM

## 2018-02-25 ENCOUNTER — Other Ambulatory Visit (HOSPITAL_COMMUNITY): Payer: Self-pay | Admitting: Physician Assistant

## 2018-02-25 DIAGNOSIS — J90 Pleural effusion, not elsewhere classified: Secondary | ICD-10-CM

## 2018-02-26 ENCOUNTER — Other Ambulatory Visit (HOSPITAL_COMMUNITY): Payer: Self-pay | Admitting: Physician Assistant

## 2018-02-26 ENCOUNTER — Ambulatory Visit (HOSPITAL_COMMUNITY)
Admission: RE | Admit: 2018-02-26 | Discharge: 2018-02-26 | Disposition: A | Discharge status: Home / Self Care | Payer: BLUE CROSS/BLUE SHIELD | Source: Ambulatory Visit | Attending: Physician Assistant | Admitting: Physician Assistant

## 2018-02-26 DIAGNOSIS — J90 Pleural effusion, not elsewhere classified: Secondary | ICD-10-CM | POA: Insufficient documentation

## 2018-02-26 MED ORDER — LIDOCAINE HCL (PF) 2 % IJ SOLN
INTRAMUSCULAR | Status: AC
  Filled 2018-02-26: qty 20

## 2018-02-26 NOTE — Progress Notes (Signed)
Request for therapeutic right thoracentesis. [facility] on 7/28 - she was referred to IR on 8/1 for right thoracentesis however it was determined there was no fluid amenable to drainage at that time. She was d/ced to home on 8/2 and has been doing well. Outpatient CXR performed 8/12 during follow with Dr. Cliffton AstersWhite and there was thought to be a moderate size right pleural effusion.   Patient returns today for right sided therapeutic thoracentesis - she denies any dyspnea or chest pain, she states minimal dry cough usually after spirometry. Limited thoracic US performed today showing scant amount of pleural fluid in right pleural space which is not amenable to drainage. Risks vs benefits of procedure discussed with patient who is agreeable with foregoing thoracentesis. Patient to keep follow up appointment with ordering provider and return to ED if she experiences worsening dyspnea or chest pain. Patient states understanding, all questions answered, d/ced to home today.   Lynnette CaffeyShannon Jacaden Forbush, PA-C 02/26/18 1149

## 2018-03-14 ENCOUNTER — Ambulatory Visit
Admission: RE | Admit: 2018-03-14 | Discharge: 2018-03-14 | Disposition: A | Discharge status: Home / Self Care | Payer: BLUE CROSS/BLUE SHIELD | Source: Ambulatory Visit | Attending: General Surgery | Admitting: General Surgery

## 2018-03-14 ENCOUNTER — Other Ambulatory Visit: Payer: Self-pay | Admitting: General Surgery

## 2018-03-14 DIAGNOSIS — J90 Pleural effusion, not elsewhere classified: Secondary | ICD-10-CM
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
## 2018-09-11 IMAGING — CR DG CHEST 2V
2 series · 2 of 2 positions shown · non-contrast
Comparison: Radiographs February 24, 2018.

CLINICAL DATA: Right pleural effusion.

EXAM:
CHEST - 2 VIEW

[w chest pa]
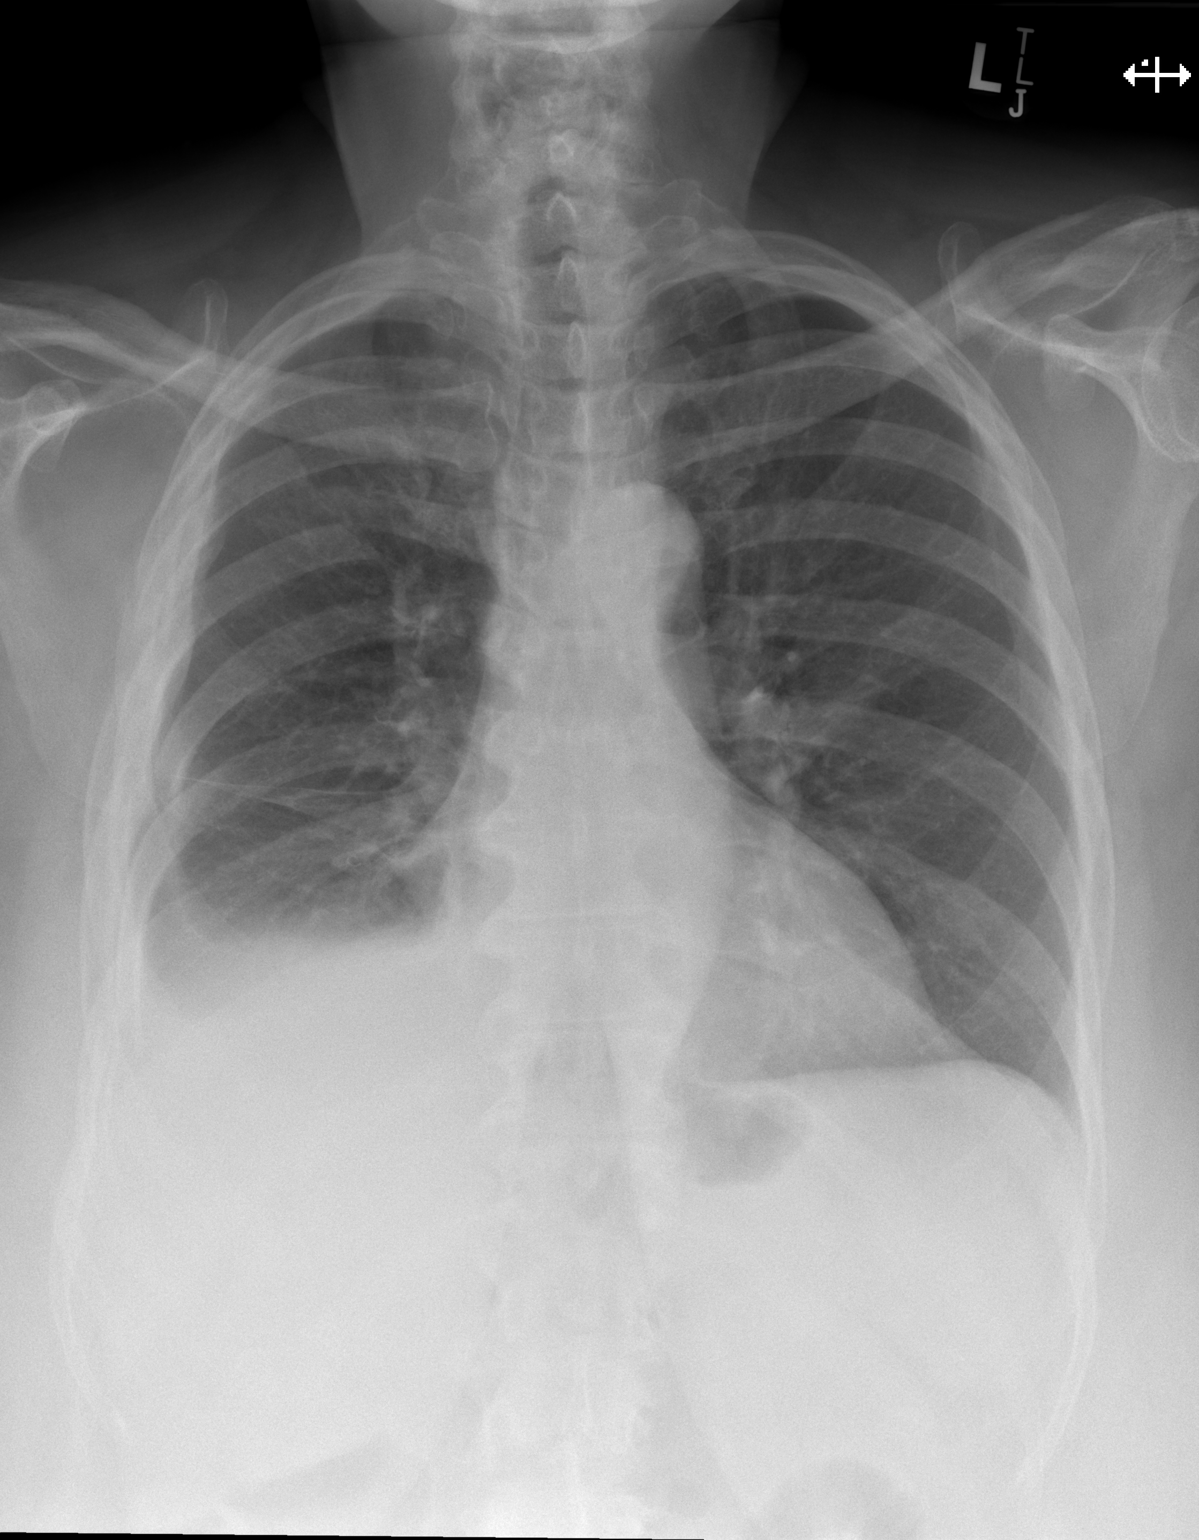

[w chest lat]
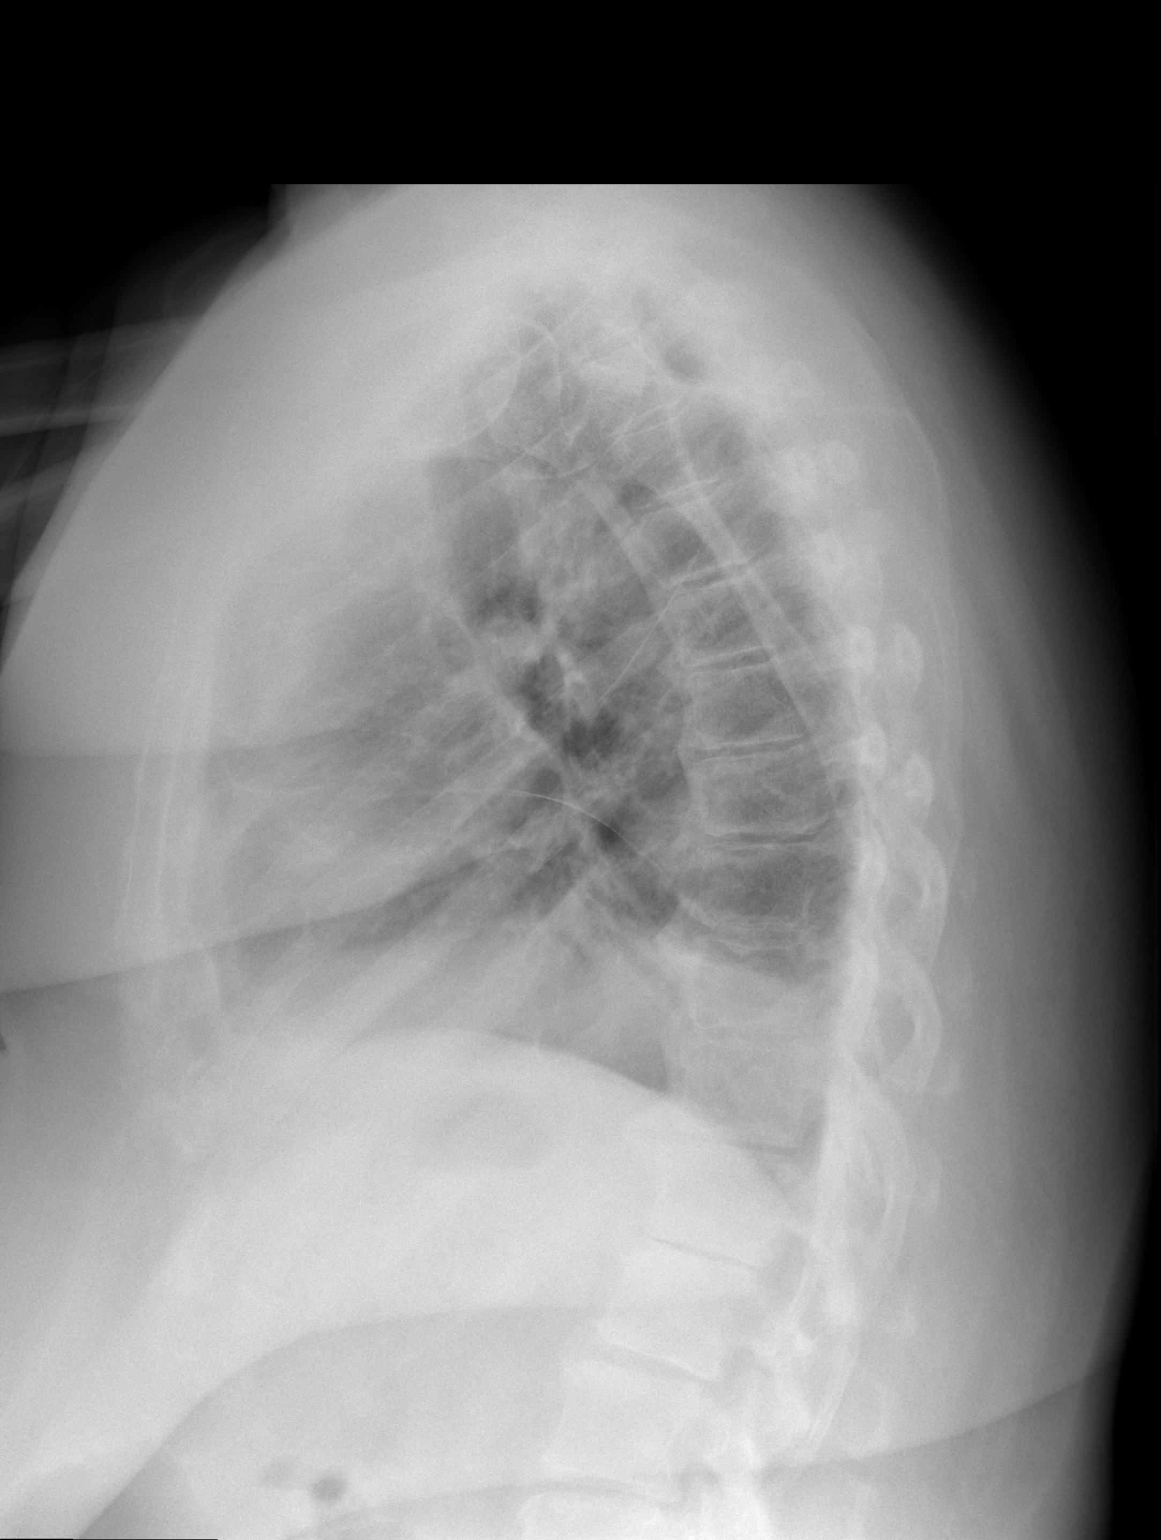

[2 of 2 positions shown; findings below may reference images not displayed]

FINDINGS: The heart size and mediastinal contours are within normal limits. No
pneumothorax is noted. Left lung is clear. Right pleural effusion is
slightly smaller compared to prior exam with probable underlying
atelectasis. Stable right rib fractures are noted.
IMPRESSION: Mildly decreased right pleural effusion with probable underlying
atelectasis. Stable right rib fractures.
